# Patient Record
Sex: Male | Born: 1960 | Race: White | Hispanic: No | Marital: Married
Health system: Midwestern US, Academic
[De-identification: ages and names within clinical notes are randomized; demographics above are authoritative.]

---

## 2017-06-06 DIAGNOSIS — R69 Illness, unspecified: Principal | ICD-10-CM

## 2017-06-07 ENCOUNTER — Encounter: Admit: 2017-06-07 | Discharge: 2017-06-07

## 2017-07-05 ENCOUNTER — Encounter: Admit: 2017-07-05 | Discharge: 2017-07-05 | Payer: BC Managed Care – PPO

## 2017-07-10 ENCOUNTER — Ambulatory Visit: Admit: 2017-07-10 | Discharge: 2017-07-11 | Payer: Commercial Managed Care - HMO

## 2017-07-10 ENCOUNTER — Encounter: Admit: 2017-07-10 | Discharge: 2017-07-10 | Payer: BC Managed Care – PPO

## 2017-07-10 DIAGNOSIS — F419 Anxiety disorder, unspecified: Principal | ICD-10-CM

## 2017-07-10 DIAGNOSIS — Q283 Other malformations of cerebral vessels: Principal | ICD-10-CM

## 2017-07-10 DIAGNOSIS — R51 Headache: ICD-10-CM

## 2017-07-10 NOTE — Progress Notes
Devin Evans is a new patient to the clinic. He is referred for evaluation and treatment recommendations for findings on MRI concerning for cavernoma. Previous imaging is available for review on a disc. He c/o speech disturbance, headache, neck pain, fatigue, joint pain, hand pain, tremor in right hand. MRI initially ordered to rule out MS.

## 2017-07-23 ENCOUNTER — Encounter: Admit: 2017-07-23 | Discharge: 2017-07-23 | Payer: BC Managed Care – PPO

## 2017-07-23 DIAGNOSIS — R69 Illness, unspecified: Principal | ICD-10-CM

## 2017-09-11 ENCOUNTER — Ambulatory Visit: Admit: 2017-09-11 | Discharge: 2017-09-11 | Payer: Commercial Managed Care - HMO

## 2017-09-11 ENCOUNTER — Ambulatory Visit: Admit: 2017-09-11 | Discharge: 2017-09-12 | Payer: Commercial Managed Care - HMO

## 2017-09-11 ENCOUNTER — Encounter: Admit: 2017-09-11 | Discharge: 2017-09-11 | Payer: BC Managed Care – PPO

## 2017-09-11 DIAGNOSIS — R51 Headache: ICD-10-CM

## 2017-09-11 DIAGNOSIS — Q283 Other malformations of cerebral vessels: Principal | ICD-10-CM

## 2017-09-11 DIAGNOSIS — F419 Anxiety disorder, unspecified: Principal | ICD-10-CM

## 2017-09-11 DIAGNOSIS — R4701 Aphasia: Principal | ICD-10-CM

## 2017-09-11 MED ORDER — GADOBENATE DIMEGLUMINE 529 MG/ML (0.1MMOL/0.2ML) IV SOLN
20 mL | Freq: Once | INTRAVENOUS | 0 refills | Status: CP
Start: 2017-09-11 — End: ?
  Administered 2017-09-11: 20:00:00 20 mL via INTRAVENOUS

## 2017-09-11 NOTE — Progress Notes
Devin Evans returns to the clinic for a 2mth FUV. A repeat MRI of the head was done today for surveillance of abnormal findings in the left temporal region concerning for cavernoma. He was last seen in the clinic on 07/10/17. Devin Evans reports he is feeling well but states he has a reaction to the MRI contrast today (itching and hives.) Feels better now and hives resolved.

## 2017-09-11 NOTE — Progress Notes
1419: Tech alerted RN, pt has a hive. Pt stated when contrast was injected, felt itching around mouth and under left arm. RN assessed patient. Hive under left arm. Redness noted behind bilateral ears and back of neck. No pain, no edema. No SOA. No trouble swallowing.    1421: BP  160/92   O2  96   Pulse 59.    1425: Notified Radiologist Dr. Zenia ResidesArshan Dehbozorgi. Assessed pt. Hives and redness subsiding. Gait steady.     1430: Per Dr. Monte Fantasiaehbozorgi.Pt stable and OK to discharge.

## 2017-09-23 ENCOUNTER — Encounter: Admit: 2017-09-23 | Discharge: 2017-09-23 | Payer: BC Managed Care – PPO

## 2017-09-23 DIAGNOSIS — R4789 Other speech disturbances: Principal | ICD-10-CM

## 2017-10-11 ENCOUNTER — Encounter: Admit: 2017-10-11 | Discharge: 2017-10-11 | Payer: BC Managed Care – PPO

## 2017-10-11 NOTE — Telephone Encounter
Initial Visit Date: 10/28/17    Reason for Visit: spells of speech arrest    Is this as a result of an injury?     Physician Information  PCP: Dr. Theodore DemarkAnn Riggs  Referring Physician: Dr. Angelia MouldKoji [facility]  Previous Neurologist: Dr. Adair LaundryVernon [facility]  Other providers seen: [facility]    Previous Imaging/Procedures:  MRI head- 09/11/17 [place]; 06/09/17 [facility] (OR)  MRI C-spine- 06/09/17 [facility] (OR)  CT   XRay   EMG [facility]  EEG 07/17/17 [facility] ([facility])  [facility]?    ED/Hospitalizations:      Current Meds:       Medications, Allergies, History Updated

## 2017-10-28 ENCOUNTER — Encounter: Admit: 2017-10-28 | Discharge: 2017-10-28 | Payer: BC Managed Care – PPO

## 2017-10-28 ENCOUNTER — Ambulatory Visit: Admit: 2017-10-28 | Discharge: 2017-10-29

## 2017-10-28 DIAGNOSIS — F419 Anxiety disorder, unspecified: Principal | ICD-10-CM

## 2017-10-28 DIAGNOSIS — R51 Headache: ICD-10-CM

## 2017-10-28 DIAGNOSIS — N200 Calculus of kidney: ICD-10-CM

## 2017-10-28 DIAGNOSIS — M1990 Unspecified osteoarthritis, unspecified site: ICD-10-CM

## 2017-10-28 DIAGNOSIS — K219 Gastro-esophageal reflux disease without esophagitis: ICD-10-CM

## 2017-10-28 DIAGNOSIS — Q283 Other malformations of cerebral vessels: ICD-10-CM

## 2017-10-28 MED ORDER — ZONISAMIDE 100 MG PO CAP
200 mg | ORAL_CAPSULE | Freq: Every evening | ORAL | 5 refills | 90.00000 days | Status: AC
Start: 2017-10-28 — End: 2017-12-16

## 2017-10-29 DIAGNOSIS — G40109 Localization-related (focal) (partial) symptomatic epilepsy and epileptic syndromes with simple partial seizures, not intractable, without status epilepticus: Principal | ICD-10-CM

## 2017-10-29 DIAGNOSIS — R4789 Other speech disturbances: ICD-10-CM
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
## 2017-11-22 ENCOUNTER — Ambulatory Visit: Admit: 2017-11-22 | Discharge: 2017-11-22

## 2017-11-22 DIAGNOSIS — Q283 Other malformations of cerebral vessels: Principal | ICD-10-CM

## 2017-12-16 ENCOUNTER — Encounter: Admit: 2017-12-16 | Discharge: 2017-12-16 | Payer: BC Managed Care – PPO

## 2017-12-16 MED ORDER — ZONISAMIDE 100 MG PO CAP
200 mg | ORAL_CAPSULE | Freq: Every evening | ORAL | 1 refills | 90.00000 days | Status: AC
Start: 2017-12-16 — End: 2018-07-02

## 2018-03-15 IMAGING — CT Abdomen^1_KIDNEY_STONE (Adult)
1 series · 15 of 32 positions shown, 19 images · non-contrast
Comparison: none

PROCEDURE: Abdomen 1_KIDNEY_STONE (Adult)
HISTORY: LOWER ABD PAIN, FEVER, BURNING WITH URINATION. KIDNEY INFECTION. HX KIDNEY
STONES WITH RETRIEVAL 2 WKS AGO.
TECHNIQUE: Axial CT imaging of the abdomen and pelvis was performed without contrast.

[Series 2: kidney stone 3.0 soft tissue · axial · 0.88mm/px · z∈[-507,-99]mm · 15 of 151 slices shown, 19 images]
[im 10/151  soft-tissue]
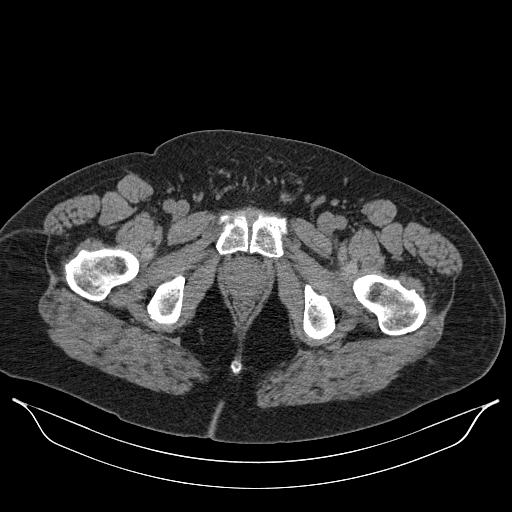
[im 10/151  bone]
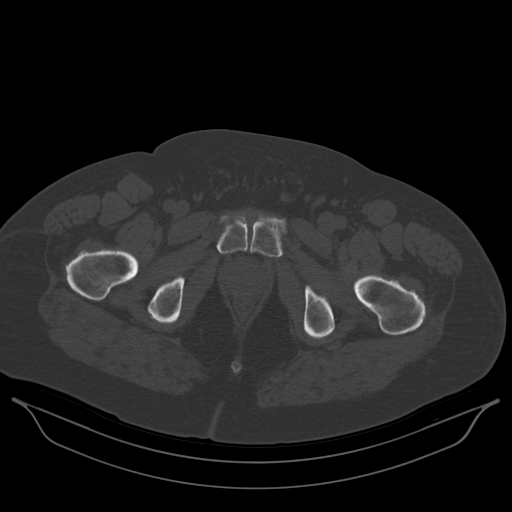
[im 20/151  soft-tissue]
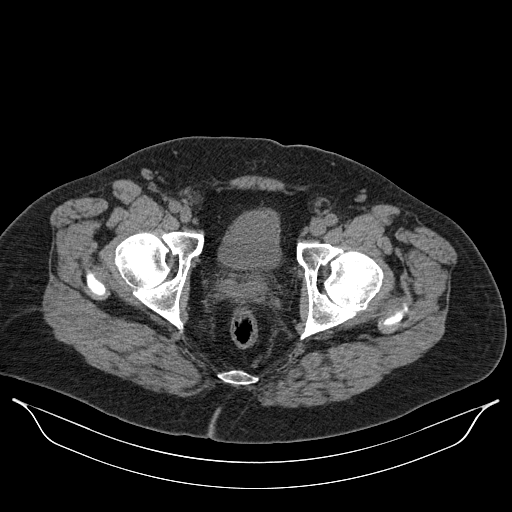
[im 30/151  soft-tissue]
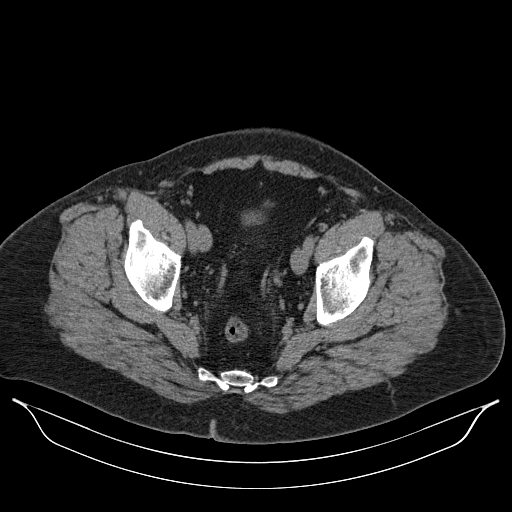
[im 44/151  soft-tissue]
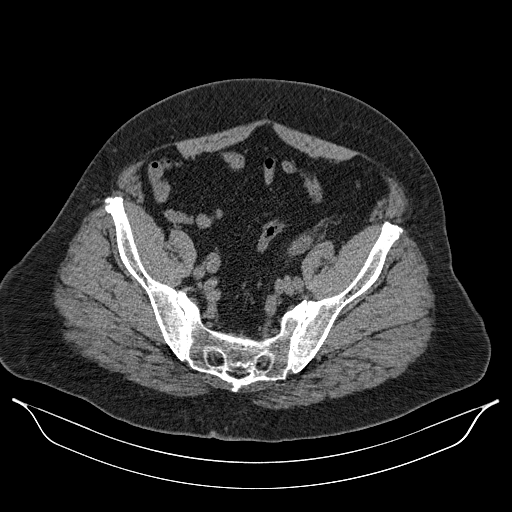
[im 54/151  soft-tissue]
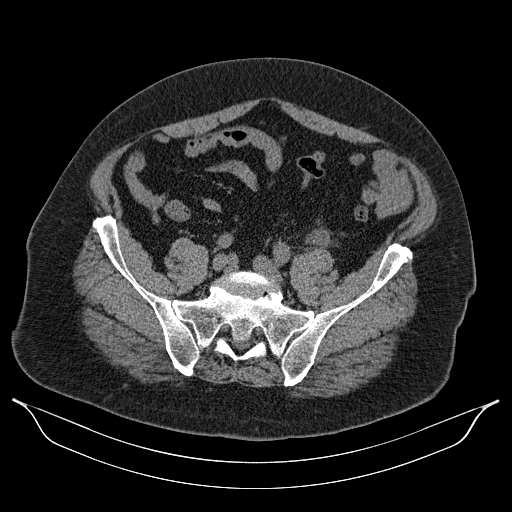
[im 63/151  soft-tissue]
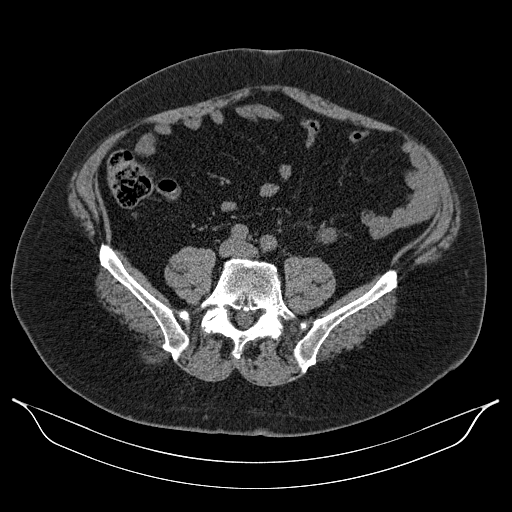
[im 78/151  soft-tissue]
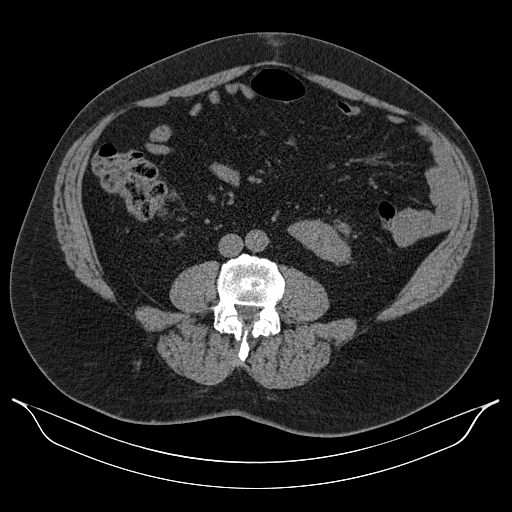
[im 88/151  soft-tissue]
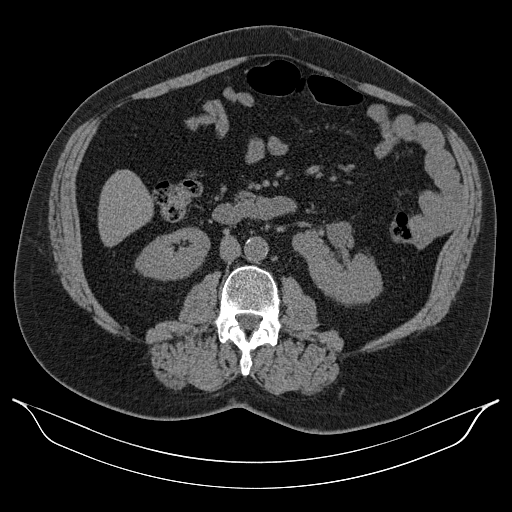
[im 97/151  soft-tissue]
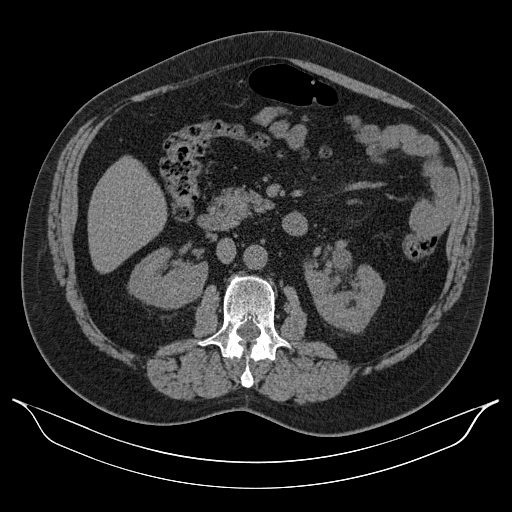
[im 97/151  bone]
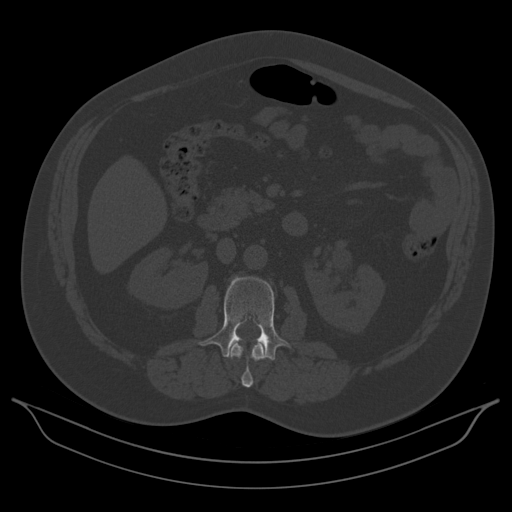
[im 107/151  soft-tissue]
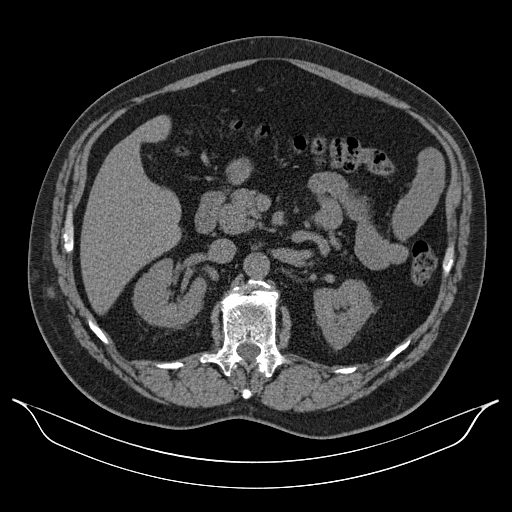
[im 121/151  soft-tissue]
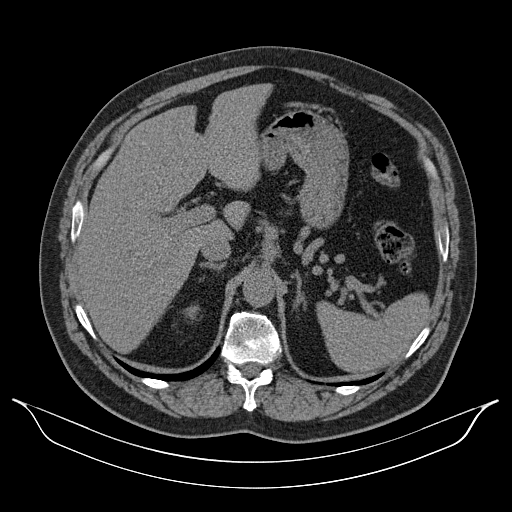
[im 131/151  soft-tissue]
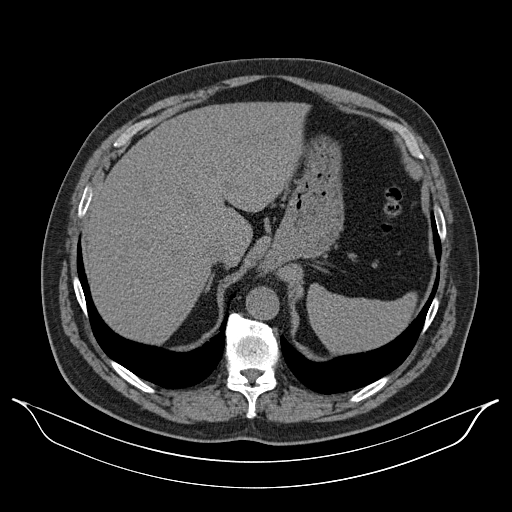
[im 131/151  lung]
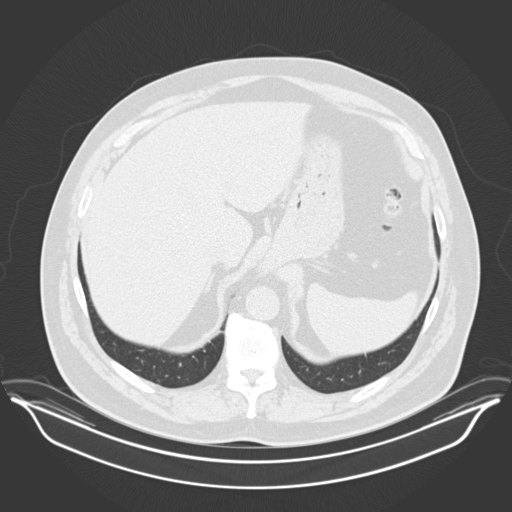
[im 136/151  lung]
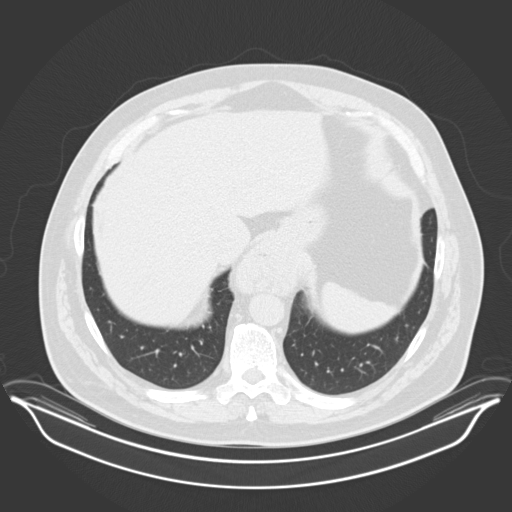
[im 141/151  soft-tissue]
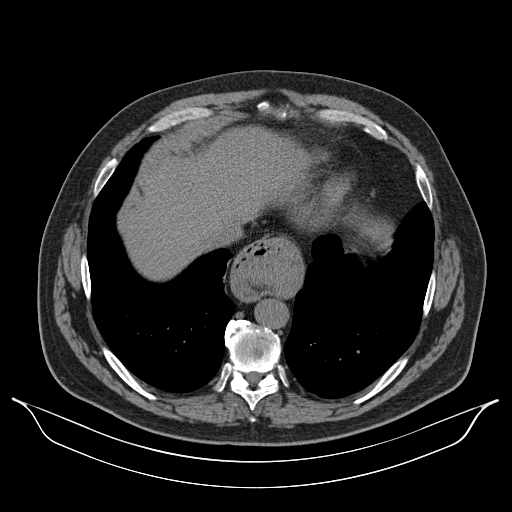
[im 141/151  lung]
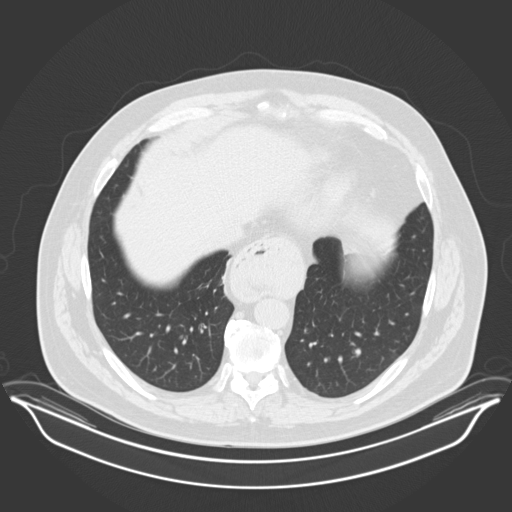
[im 146/151  lung]
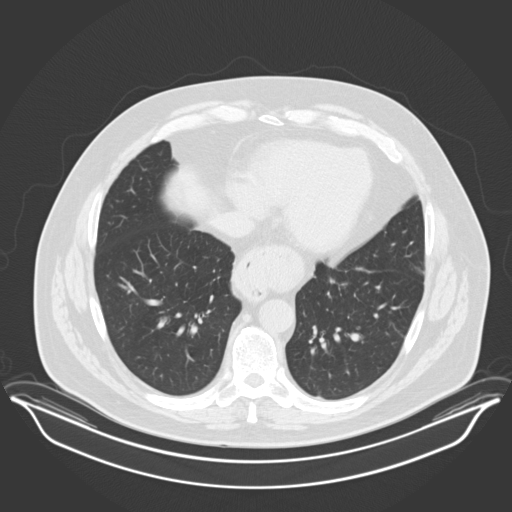

[15 of 32 positions shown; findings below may reference images not displayed]

FINDINGS: The lung bases are clear. Moderate-sized hiatal hernia is seen. The noncontrast
visualized portions of the liver, spleen, gallbladder, pancreas, and adrenals are within
normal limits. The right kidney shows no hydronephrosis, perinephric fat stranding, or
obstructing renal calculi. The left kidney is mildly malrotated with mild hydronephrosis and
moderate left hydroureter. No obstructing calculus is seen along the course of the dilated
ureter which is enlarged to the ureterovesicular junction.. The aorta is normal in course and
caliber. No pathologically dilated loops of large or small bowel are seen. Noninflammatory
appendix is seen in the right lower quadrant. There is no free air or free fluid seen in the
abdomen. The noncontrast urinary bladder is unremarkable.
IMPRESSION: 1. Mild hydronephrosis is seen of the left kidney with moderate hydroureter which is
enlarged to the level of the urinary bladder without visualized obstructing renal calculus.
This could reflect effects of recent passed renal calculus versus distal obstruction from
neoplasm or infection. Follow-up CT recommended.
2. Nonobstructive bowel gas pattern without free air.

Tech Notes:

ABD PAIN, FEVER, BURNING WITH URINATION.  HX RENAL STONES WITH RETRIEVAL 2 WKS AGO.  POSITIVE FOR
UTI AND KIDNEY INFECTION

## 2018-06-08 ENCOUNTER — Encounter: Admit: 2018-06-08 | Discharge: 2018-06-08 | Payer: BC Managed Care – PPO

## 2018-06-09 MED ORDER — ZONISAMIDE 100 MG PO CAP
200 mg | ORAL_CAPSULE | Freq: Every evening | ORAL | 1 refills
Start: 2018-06-09 — End: ?

## 2018-07-02 ENCOUNTER — Ambulatory Visit: Admit: 2018-07-02 | Discharge: 2018-07-03 | Payer: BC Managed Care – PPO

## 2018-07-02 ENCOUNTER — Encounter: Admit: 2018-07-02 | Discharge: 2018-07-02 | Payer: BC Managed Care – PPO

## 2018-07-02 DIAGNOSIS — Q283 Other malformations of cerebral vessels: ICD-10-CM

## 2018-07-02 DIAGNOSIS — K219 Gastro-esophageal reflux disease without esophagitis: ICD-10-CM

## 2018-07-02 DIAGNOSIS — G40109 Localization-related (focal) (partial) symptomatic epilepsy and epileptic syndromes with simple partial seizures, not intractable, without status epilepticus: Principal | ICD-10-CM

## 2018-07-02 DIAGNOSIS — R51 Headache: ICD-10-CM

## 2018-07-02 DIAGNOSIS — F419 Anxiety disorder, unspecified: Principal | ICD-10-CM

## 2018-07-02 DIAGNOSIS — M1990 Unspecified osteoarthritis, unspecified site: ICD-10-CM

## 2018-07-02 DIAGNOSIS — N200 Calculus of kidney: ICD-10-CM

## 2018-07-02 MED ORDER — ZONISAMIDE 100 MG PO CAP
300 mg | ORAL_CAPSULE | Freq: Every evening | ORAL | 1 refills | 90.00000 days | Status: AC
Start: 2018-07-02 — End: 2018-07-22

## 2018-07-03 ENCOUNTER — Encounter: Admit: 2018-07-03 | Discharge: 2018-07-03 | Payer: BC Managed Care – PPO

## 2018-07-03 MED ORDER — ZONISAMIDE 100 MG PO CAP
300 mg | ORAL_CAPSULE | Freq: Every evening | ORAL | 0 refills | 90.00000 days | Status: AC
Start: 2018-07-03 — End: 2018-07-17

## 2018-07-17 ENCOUNTER — Encounter: Admit: 2018-07-17 | Discharge: 2018-07-17 | Payer: BC Managed Care – PPO

## 2018-07-17 MED ORDER — ZONISAMIDE 100 MG PO CAP
300 mg | ORAL_CAPSULE | Freq: Every evening | ORAL | 0 refills | 90.00000 days | Status: AC
Start: 2018-07-17 — End: 2018-07-22

## 2018-07-21 ENCOUNTER — Encounter: Admit: 2018-07-21 | Discharge: 2018-07-21 | Payer: BC Managed Care – PPO

## 2018-07-22 MED ORDER — ZONISAMIDE 100 MG PO CAP
300 mg | ORAL_CAPSULE | Freq: Every evening | ORAL | 1 refills | 90.00000 days | Status: AC
Start: 2018-07-22 — End: 2019-02-23

## 2018-12-18 ENCOUNTER — Encounter: Admit: 2018-12-18 | Discharge: 2018-12-18 | Payer: BC Managed Care – PPO

## 2018-12-31 ENCOUNTER — Encounter: Admit: 2018-12-31 | Discharge: 2018-12-31 | Payer: BC Managed Care – PPO

## 2018-12-31 ENCOUNTER — Ambulatory Visit: Admit: 2018-12-31 | Discharge: 2019-01-01 | Payer: BC Managed Care – PPO

## 2018-12-31 DIAGNOSIS — Q283 Other malformations of cerebral vessels: ICD-10-CM

## 2018-12-31 DIAGNOSIS — N200 Calculus of kidney: ICD-10-CM

## 2018-12-31 DIAGNOSIS — G4733 Obstructive sleep apnea (adult) (pediatric): Principal | ICD-10-CM

## 2018-12-31 DIAGNOSIS — K219 Gastro-esophageal reflux disease without esophagitis: ICD-10-CM

## 2018-12-31 DIAGNOSIS — F419 Anxiety disorder, unspecified: Principal | ICD-10-CM

## 2018-12-31 DIAGNOSIS — R51 Headache: ICD-10-CM

## 2018-12-31 DIAGNOSIS — M1990 Unspecified osteoarthritis, unspecified site: ICD-10-CM

## 2018-12-31 MED ORDER — LAMOTRIGINE 25 MG PO TAB
ORAL_TABLET | 0 refills | Status: AC
Start: 2018-12-31 — End: 2019-07-20

## 2018-12-31 MED ORDER — LAMOTRIGINE 100 MG PO TAB
100 mg | ORAL_TABLET | Freq: Two times a day (BID) | ORAL | 5 refills | Status: AC
Start: 2018-12-31 — End: 2019-07-20

## 2018-12-31 NOTE — Progress Notes
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  We discussed several treatment options including increasing zonisamide, adding a second agent, and realistic expectations of seizure freedom with AEDs, which I think is unfortunately low.  We discussed the challenges of surgery given the location of his cavernoma so that's a difficult option as well.  We also discussed that as long as he does not have alteration of consciousness, it may be the best option just to make the best of medical management.    For now, it's certainly warranted to try a second medication so we will start with lamotrigine.  It will begin at 25mg  qHS and titrate up to 100mg  2x daily over an 8 week course.    His mild headaches, fatigue, and snoring do raise the possibility of OSA.  He will be referred to Dr. Andria Meuse for further evaluation and management.    I spent 30 minutes on the phone with Devin Evans and his wife.    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  Continue to follow with Dr. Lawson Fiscal for cavernoma surveillance  2.  Referral to Dr. Andria Meuse for sleep evaluation  3.  Trial lamotrigine 25mg  qHS and increase by 25mg  per week to a goal dose of 100mg  2x daily    The patient is instructed not to drive unless 6 months free of alteration

## 2019-02-23 ENCOUNTER — Encounter: Admit: 2019-02-23 | Discharge: 2019-02-23 | Payer: BC Managed Care – PPO

## 2019-02-23 MED ORDER — ZONISAMIDE 100 MG PO CAP
ORAL_CAPSULE | Freq: Every evening | ORAL | 1 refills | 90.00000 days | Status: DC
Start: 2019-02-23 — End: 2019-07-30

## 2019-02-23 NOTE — Telephone Encounter
Dr. Ronda Fairly received a refill request for zonisamide. This was not mentioned to continue in the last office note. Pt was also started on lamotrigine at the appt. Do you want him to continue the zonisamide at 300mg  QHS?

## 2019-07-09 ENCOUNTER — Encounter: Admit: 2019-07-09 | Discharge: 2019-07-09 | Payer: BC Managed Care – PPO

## 2019-07-13 ENCOUNTER — Encounter

## 2019-07-13 DIAGNOSIS — F419 Anxiety disorder, unspecified: Secondary | ICD-10-CM

## 2019-07-13 DIAGNOSIS — K219 Gastro-esophageal reflux disease without esophagitis: Secondary | ICD-10-CM

## 2019-07-13 DIAGNOSIS — Q283 Other malformations of cerebral vessels: Secondary | ICD-10-CM

## 2019-07-13 DIAGNOSIS — G4733 Obstructive sleep apnea (adult) (pediatric): Secondary | ICD-10-CM

## 2019-07-13 DIAGNOSIS — R51 Headache: Secondary | ICD-10-CM

## 2019-07-13 DIAGNOSIS — N200 Calculus of kidney: Secondary | ICD-10-CM

## 2019-07-13 DIAGNOSIS — M1990 Unspecified osteoarthritis, unspecified site: Secondary | ICD-10-CM

## 2019-07-13 NOTE — Assessment & Plan Note
HST 

## 2019-07-17 NOTE — Progress Notes
COVID-19 TELEHEALTH VISIT    History of Present Illness  I last saw Mr. Stalcup on 07/02/2018 for left FLE.  In the interim, he established with Dr. Andria Meuse for sleep issues with ordering of a sleep study.  He was trialed on lamotrigine.  He thinks he has moved to aura only.  It lasts a second or two.  He notes he has some balance difficulty which he describes as a slight stumble.  His wife definitely thinks his balance is worse.    His wife notes there are episodes with during sleeping that they call vivid dreams.  They note he is making these sorts of movements as he wakes up.  His wife seems they look deliberate and they aren't like he's uncontrolled.  Sometimes he can speak during them with variable intelligence.  He can turn towards the left during these.    Medication side effects  Vivid dreams, imbalance    Previous tests  1. [place] EEG (11/22/17) - One C3 > T7 sharp transient of unclear significance, otherwise normal (report reviewed)  ?  2.  [place] MRI brain (09/11/17)?- left frontal opercular cavernoma likely posterior to speech (imaging reviewed)    Review of systems  The following systems were reviewed and were negative except as mentioned elsewhere:      Constitutional:  No fever/chills/sweat, weight loss, or poor appetite  Eyes:  No reduced vision or blurriness, double vision, or droopy eyelids  ENT:  No hearing loss, ringing in the ears, vertigo, or hoarseness  Cardiovascular:  No chest pain/angina or palpitations  Respiratory:  No shortness of breath or cough  Gastrointestinal:  No abdominal pain, nausea and/or vomiting, diarrhea, or constipation  Genitourinary:  No pain with urination, excessive urination, or incontinence  Musculoskeletal:  No back pain, neck pain, joint pain/redness/swelling, or myalgia  Skin:  No jaundice, rash, or change in sweating  Hematologic/ Lymphatic:  No anemia, easy bruising, bleeding, or enlarged lymph nodes  Endocrine:  No temperature intolerance, polyuria, or polydipsia Allergic/ Immunological:  No severe allergic reaction  Psychiatric:  No depression or anxiety    Current Outpatient Medications on File Prior to Visit   Medication Sig Dispense Refill   ? citalopram (CELEXA) 20 mg tablet Take 20 mg by mouth daily.     ? lamoTRIgine (LAMICTAL) 100 mg tablet Take one tablet by mouth twice daily. 60 tablet 5   ? meloxicam (MOBIC) 15 mg tablet Take 15 mg by mouth daily.     ? MULTIVITAMIN PO Take 1 tablet by mouth daily.     ? omeprazole DR(+) (PRILOSEC) 40 mg capsule Take 40 mg by mouth daily before breakfast.     ? tamsulosin (FLOMAX) 0.4 mg capsule Take 0.4 mg by mouth daily. Do not crush, chew or open capsules. Take 30 minutes following the same meal each day.     ? zonisamide (ZONEGRAN) 100 mg capsule TAKE 3 CAPSULES DAILY AT   BEDTIME 270 capsule 1     No current facility-administered medications on file prior to visit.      Exam  General appearance: He is not in acute distress.    HEENT: The head is normocephalic without evidence of previous trauma.  Oropharynx is without obvious lesion.  Eyes: Normal sclera  Neck: Full range of motion  Pulmonary: Notable for unlabored breathing.    Skin: Exhibits normal temperature and showed no rash.    Extremities: No edema is noted.  Psychiatric: Normal thought process and appropriate affect  NEUROLOGICAL EXAM  Mental Status: The patient is attentive, alert, and oriented to person, time and place.  Language comprehension, naming, concentration, and recent/remote memory are intact.  Speech is fluent.    Cranial Nerves: Pupils are equal.  Visual fields are grossly intact.  Extraocular movements are intact without nystagmus.  Facial strength is normal.  Hearing is intact to conversation.  Tongue and uvular are in the midline.  Shoulder shrug is normal bilaterally.      Motor: Muscle bulk is normal.  Motor power is at least 3/5 in the bilateral deltoids, biceps, triceps, wrist extensors, grip, iliopsoas, quads, hamstrings, gastrocnemius, and tibialis anterior.  There is no pronator drift or fixation.    Sensory: Sensation is intact to light touch via patient touching his/her arm/leg and affirming sensation.    Cerebellar:  Finger to nose test shows no dysmetria.  Rapid alternating movements are normal.  There is no abnormal movement.      Gait: Normal stance, arm swing, and stride length.  No ataxia is noted.    Impression  CLINICAL SUMMARY   Mr. 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  We can't go up further on the lamotrigine due to side effect.  He may be having hypermotor seizures at night.  Therefore, we will taper off lamotrigine and trial lacosamide.    We had a candid conversation about the potential for surgery if he continues to have seizures on lacosamide.  The first step would be to undergo EMU testing.    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 Continue to follow with Dr. Lawson Fiscal for cavernoma surveillance  2. Continue zonisamide 300mg  qHS  3. Taper off lamotrigine  4. Start lacosamide 50mg  qHS and increase to 100mg  2x daily over a 6 week course    After a discussion, the patient agrees with the plan.  Total time for discussion was 25 minutes.  I spent 15 minutes counseling the patient on matters pertaining to epilepsy.    The patient is instructed not to drive unless 6 months free of alteration of consciousness, not to work in close proximity of machines with moving parts, not to swim unsupervised or to work at high places. The patient is to shower (without accumulation of water) instead of taking a bath if unsupervised.    FOLLOWUP PLAN  I plan to see the patient back for follow-up in approximately 6 months. The patient is instructed to contact us if there is an exacerbation of the seizures or any side effects from the antiepileptic medications.

## 2019-07-20 MED ORDER — LACOSAMIDE 50 MG PO TAB
50 mg | ORAL_TABLET | Freq: Two times a day (BID) | ORAL | 0 refills | 52.50000 days | Status: DC
Start: 2019-07-20 — End: 2019-09-22

## 2019-07-20 MED ORDER — LACOSAMIDE 100 MG PO TAB
100 mg | ORAL_TABLET | Freq: Two times a day (BID) | ORAL | 5 refills | 52.50000 days | Status: DC
Start: 2019-07-20 — End: 2019-09-22

## 2019-07-21 ENCOUNTER — Encounter: Admit: 2019-07-21 | Discharge: 2019-07-21 | Payer: BC Managed Care – PPO

## 2019-07-21 NOTE — Telephone Encounter
Pt LVM stating that he pharmacy did not received the prescriptions for Vimpat.     Called [facility] and confirmed that they did receive the prescriptions, but they did not have it in stock so it had to be ordered. Claim ran through insurance and one Rx was $743 and the other $221. Pt has a $1500 deductible that must be met first. Called pt and discussed this and instructed him to go to the website to enroll in copay card. Instructed pt that if the copay card does not lower the cost to a reasonable amount they can afford to please call back. Verbalized understanding.

## 2019-07-21 NOTE — Telephone Encounter
Called and discussed the titration schedules for Vimpat and lamotrigine. Pt was able to view this on his mychart AVS so he has this in writing. Directed pt to the [email or web address] website to enroll for a copay card. Instructed him to please call back if there is any issues with cost of this medication. Informed a PA may be needed and the pharmacy should notify the clinic if this is required. Verbalized understanding.

## 2019-07-30 ENCOUNTER — Encounter: Admit: 2019-07-30 | Discharge: 2019-07-30 | Payer: BC Managed Care – PPO

## 2019-07-30 MED ORDER — ZONISAMIDE 100 MG PO CAP
300 mg | ORAL_CAPSULE | Freq: Every evening | ORAL | 1 refills | 90.00000 days | Status: DC
Start: 2019-07-30 — End: 2020-01-28

## 2019-09-22 ENCOUNTER — Encounter: Admit: 2019-09-22 | Discharge: 2019-09-22

## 2019-09-22 MED ORDER — LAMOTRIGINE 25 MG PO TAB
25 mg | ORAL_TABLET | Freq: Every evening | ORAL | 0 refills | Status: DC
Start: 2019-09-22 — End: 2020-01-27

## 2019-09-22 MED ORDER — LAMOTRIGINE 100 MG PO TAB
100 mg | ORAL_TABLET | Freq: Two times a day (BID) | ORAL | 1 refills | Status: DC
Start: 2019-09-22 — End: 2020-01-27
  Filled 2019-09-22: qty 180, 90d supply, fill #1

## 2019-09-22 NOTE — Telephone Encounter
Per Dr. Estrella Myrtle, ok to restart lamotrigine. Start at 25mg  QHS and increase by 25mg  weekly until at a goal of 100mg  BID.    Lamotrigine titration instructions                    AM       PM   Week 1      0          25mg    Week 2      0          50mg    Week 3      0          75mg    Week 4      0         100mg    Week 5    25mg      100mg    Week 6    50mg      100mg    Week 7    75mg      100mg    Week 8    100mg    100mg      Called pt and informed of the next steps. Verbalized understanding. Pt did like the Vimpat better than the lamotrigine, but unfortunately at this time he would not be able to afford this cost. Lamotrigine 25mg  Rx sent to the local pharmacy for pt to get started on the titration schedule today. The maintenance Rx for 100mg  tablet sent to [facility], [place] order pharmacy.

## 2019-09-22 NOTE — Telephone Encounter
Dr. Estrella Myrtle, received voicemail from pt stating that he is having a problem getting his Vimpat covered. He stated the price has gone up significantly and is having to pay over $300 for a 30 day supply. The pharmacy also did not have this in stock and had to order the medication. He has been out of medication since Saturday. He has tried to use the copay card, but it is still costing him over $300. Pt is wondering if he could go back to the old prescription, lamotrigine?, since that was only ~$8/month.     Called pt's pharmacy to verify the issue. Per Butch Penny at [facility] the pt has a primary and secondary insurance that is still leaving him with a copay of over $700, the copay card brings it down to over $300. Myrle Sheng with Vimpat to discuss options. Was informed the that copay card for Vimpat only covers up to $1300 per YEAR. The pharmacist informed me that the first Rx for 50mg  the copay card paid ~$200, then the second Rx for the 100mg  tablet paid ~$724, then this 3rd filled only $376 would be covered. Unfortunately there is nothing else the Vimpat copay card or patient assistance program can do.     Please advise on next steps. Pt is completely out of Vimpat.

## 2019-09-23 ENCOUNTER — Encounter: Admit: 2019-09-23 | Discharge: 2019-09-23

## 2019-09-24 ENCOUNTER — Encounter: Admit: 2019-09-24 | Discharge: 2019-09-24

## 2020-01-18 ENCOUNTER — Encounter: Admit: 2020-01-18 | Discharge: 2020-01-18

## 2020-01-18 MED FILL — LAMOTRIGINE 100 MG PO TAB: 100 mg | ORAL | 90 days supply | Qty: 180 | Fill #2 | Status: CN

## 2020-01-20 ENCOUNTER — Encounter: Admit: 2020-01-20 | Discharge: 2020-01-20

## 2020-01-20 MED FILL — LAMOTRIGINE 100 MG PO TAB: 100 mg | ORAL | 90 days supply | Qty: 180 | Fill #2 | Status: AC

## 2020-01-27 ENCOUNTER — Encounter: Admit: 2020-01-27 | Discharge: 2020-01-27

## 2020-01-27 DIAGNOSIS — Q283 Other malformations of cerebral vessels: Secondary | ICD-10-CM

## 2020-01-27 DIAGNOSIS — F419 Anxiety disorder, unspecified: Secondary | ICD-10-CM

## 2020-01-27 DIAGNOSIS — R519 Generalized headaches: Secondary | ICD-10-CM

## 2020-01-27 DIAGNOSIS — M1990 Unspecified osteoarthritis, unspecified site: Secondary | ICD-10-CM

## 2020-01-27 DIAGNOSIS — N200 Calculus of kidney: Secondary | ICD-10-CM

## 2020-01-27 DIAGNOSIS — K219 Gastro-esophageal reflux disease without esophagitis: Secondary | ICD-10-CM

## 2020-01-27 MED ORDER — LAMOTRIGINE 100 MG PO TAB
200 mg | ORAL_TABLET | Freq: Two times a day (BID) | ORAL | 3 refills | Status: AC
Start: 2020-01-27 — End: ?

## 2020-01-27 MED ORDER — LAMOTRIGINE 25 MG PO TAB
ORAL_TABLET | 0 refills | Status: DC
Start: 2020-01-27 — End: 2020-01-28
  Filled 2020-01-28: qty 84, 49d supply, fill #1

## 2020-01-28 ENCOUNTER — Encounter: Admit: 2020-01-28 | Discharge: 2020-01-28

## 2020-01-28 MED ORDER — ZONISAMIDE 100 MG PO CAP
300 mg | ORAL_CAPSULE | Freq: Every evening | ORAL | 1 refills | 90.00000 days | Status: AC
Start: 2020-01-28 — End: ?
  Filled 2020-01-31: qty 270, 90d supply, fill #1

## 2020-01-28 MED ORDER — LAMOTRIGINE 25 MG PO TAB
ORAL_TABLET | 0 refills | Status: DC
Start: 2020-01-28 — End: 2020-02-08

## 2020-01-29 ENCOUNTER — Encounter: Admit: 2020-01-29 | Discharge: 2020-01-29

## 2020-01-31 ENCOUNTER — Encounter: Admit: 2020-01-31 | Discharge: 2020-01-31

## 2020-02-08 MED ORDER — LAMOTRIGINE 25 MG PO TAB
ORAL_TABLET | Freq: Every evening | 0 refills | Status: AC
Start: 2020-02-08 — End: ?
  Filled 2020-02-09: qty 84, 70d supply, fill #1

## 2020-03-29 ENCOUNTER — Encounter: Admit: 2020-03-29 | Discharge: 2020-03-29

## 2020-03-30 MED FILL — LAMOTRIGINE 100 MG PO TAB: 100 mg | ORAL | 90 days supply | Qty: 360 | Fill #1 | Status: AC

## 2020-06-28 ENCOUNTER — Encounter: Admit: 2020-06-28 | Discharge: 2020-06-28

## 2020-06-28 MED FILL — LAMOTRIGINE 100 MG PO TAB: 100 mg | ORAL | 90 days supply | Qty: 360 | Fill #2 | Status: AC

## 2020-07-13 ENCOUNTER — Encounter: Admit: 2020-07-13 | Discharge: 2020-07-13

## 2020-07-18 ENCOUNTER — Encounter: Admit: 2020-07-18 | Discharge: 2020-07-18

## 2020-07-29 ENCOUNTER — Encounter: Admit: 2020-07-29 | Discharge: 2020-07-29

## 2020-07-29 MED ORDER — ZONISAMIDE 100 MG PO CAP
200 mg | ORAL_CAPSULE | Freq: Every evening | ORAL | 0 refills | 90.00000 days | Status: AC
Start: 2020-07-29 — End: ?
  Filled 2020-07-29: qty 270, 90d supply, fill #2

## 2020-07-31 ENCOUNTER — Encounter: Admit: 2020-07-31 | Discharge: 2020-07-31

## 2020-07-31 MED ORDER — LAMOTRIGINE 25 MG PO TAB
ORAL_TABLET | Freq: Every evening | 0 refills
Start: 2020-07-31 — End: ?

## 2020-09-19 ENCOUNTER — Encounter: Admit: 2020-09-19 | Discharge: 2020-09-19

## 2020-09-21 ENCOUNTER — Encounter: Admit: 2020-09-21 | Discharge: 2020-09-21

## 2020-09-21 MED FILL — LAMOTRIGINE 100 MG PO TAB: 100 mg | ORAL | 90 days supply | Qty: 360 | Fill #3 | Status: AC

## 2020-10-03 ENCOUNTER — Encounter: Admit: 2020-10-03 | Discharge: 2020-10-03

## 2020-10-05 ENCOUNTER — Encounter: Admit: 2020-10-05 | Discharge: 2020-10-05

## 2020-11-01 ENCOUNTER — Encounter: Admit: 2020-11-01 | Discharge: 2020-11-01

## 2020-11-01 MED ORDER — ZONISAMIDE 100 MG PO CAP
300 mg | ORAL_CAPSULE | Freq: Every evening | ORAL | 1 refills | 90.00000 days | Status: AC
Start: 2020-11-01 — End: ?
  Filled 2020-11-01: qty 270, 90d supply, fill #1

## 2020-12-20 ENCOUNTER — Encounter: Admit: 2020-12-20 | Discharge: 2020-12-20

## 2020-12-20 MED FILL — LAMOTRIGINE 100 MG PO TAB: 100 mg | ORAL | 90 days supply | Qty: 360 | Fill #4 | Status: AC

## 2021-01-25 ENCOUNTER — Encounter: Admit: 2021-01-25 | Discharge: 2021-01-25

## 2021-01-25 MED FILL — ZONISAMIDE 100 MG PO CAP: 100 mg | ORAL | 90 days supply | Qty: 270 | Fill #2 | Status: AC

## 2021-03-21 ENCOUNTER — Encounter: Admit: 2021-03-21 | Discharge: 2021-03-21

## 2021-03-21 MED FILL — LAMOTRIGINE 100 MG PO TAB: 100 mg | ORAL | 90 days supply | Qty: 360 | Fill #1 | Status: AC

## 2021-04-25 ENCOUNTER — Encounter: Admit: 2021-04-25 | Discharge: 2021-04-25

## 2021-04-25 MED ORDER — ZONISAMIDE 100 MG PO CAP
300 mg | ORAL_CAPSULE | Freq: Every evening | ORAL | 1 refills
Start: 2021-04-25 — End: ?

## 2021-04-26 ENCOUNTER — Encounter: Admit: 2021-04-26 | Discharge: 2021-04-26

## 2021-04-26 MED FILL — ZONISAMIDE 100 MG PO CAP: 100 mg | ORAL | 30 days supply | Qty: 90 | Fill #1 | Status: AC

## 2021-05-02 ENCOUNTER — Encounter: Admit: 2021-05-02 | Discharge: 2021-05-02

## 2021-05-02 ENCOUNTER — Ambulatory Visit: Admit: 2021-05-02 | Discharge: 2021-05-03 | Payer: BC Managed Care – PPO

## 2021-05-02 DIAGNOSIS — Q283 Other malformations of cerebral vessels: Secondary | ICD-10-CM

## 2021-05-02 DIAGNOSIS — N200 Calculus of kidney: Secondary | ICD-10-CM

## 2021-05-02 DIAGNOSIS — R519 Generalized headaches: Secondary | ICD-10-CM

## 2021-05-02 DIAGNOSIS — M1990 Unspecified osteoarthritis, unspecified site: Secondary | ICD-10-CM

## 2021-05-02 DIAGNOSIS — G40109 Localization-related (focal) (partial) symptomatic epilepsy and epileptic syndromes with simple partial seizures, not intractable, without status epilepticus: Secondary | ICD-10-CM

## 2021-05-02 DIAGNOSIS — K219 Gastro-esophageal reflux disease without esophagitis: Secondary | ICD-10-CM

## 2021-05-02 DIAGNOSIS — F419 Anxiety disorder, unspecified: Secondary | ICD-10-CM

## 2021-05-02 MED ORDER — ZONISAMIDE 100 MG PO CAP
200 mg | ORAL_CAPSULE | Freq: Every evening | ORAL | 3 refills | Status: CN
Start: 2021-05-02 — End: ?

## 2021-05-02 MED ORDER — LAMOTRIGINE 100 MG PO TAB
200 mg | ORAL_TABLET | Freq: Two times a day (BID) | ORAL | 3 refills | Status: CN
Start: 2021-05-02 — End: ?

## 2021-05-02 NOTE — Progress Notes
COVID-19 TELEHEALTH VISIT    History of Present Illness  I last saw Mr. Devin Evans on 01/27/2020 for left FLE related to a left frontal opercular cavernoma.  We discussed decreasing the zonisamide to 200mg , but today he is taking 300mg .  He continues to have occasional seizures.  He describes these as primarily aphasic seizures.  During these he can feel like his face gets hot and can feel like he might black out.  He can have some nocturnal hypermotor seizures as well, probably every 3-4 months.    Medication side effects  Fatigue    Previous tests  1. [place] EEG (11/22/17) - One C3 > T7 sharp transient of unclear significance, otherwise normal (report reviewed)  ?  2. [place] MRI brain (09/11/17)?- left frontal opercular cavernoma (imaging reviewed)    Review of systems  The following systems were reviewed and were negative except as mentioned elsewhere:      Constitutional:  No fever/chills/sweat, weight loss, or poor appetite  Eyes:  No reduced vision or blurriness, double vision, or droopy eyelids  ENT:  No hearing loss, ringing in the ears, vertigo, or hoarseness  Cardiovascular:  No chest pain/angina or palpitations  Respiratory:  No shortness of breath or cough  Gastrointestinal:  No abdominal pain, nausea and/or vomiting, diarrhea, or constipation  Genitourinary:  No pain with urination, excessive urination, or incontinence  Musculoskeletal:  No back pain, neck pain, joint pain/redness/swelling, or myalgia  Skin:  No jaundice, rash, or change in sweating  Hematologic/ Lymphatic:  No anemia, easy bruising, bleeding, or enlarged lymph nodes  Endocrine:  No temperature intolerance, polyuria, or polydipsia  Allergic/ Immunological:  No severe allergic reaction  Psychiatric:  No depression or anxiety    Current Outpatient Medications on File Prior to Visit   Medication Sig Dispense Refill   ? atorvastatin (LIPITOR) 40 mg tablet Take 40 mg by mouth daily.     ? citalopram (CELEXA) 20 mg tablet Take 20 mg by mouth daily.     ? lamoTRIgine (LAMICTAL) 100 mg tablet Take two tablets by mouth twice daily. 360 tablet 0   ? meloxicam (MOBIC) 15 mg tablet Take 15 mg by mouth daily.     ? MULTIVITAMIN PO Take 1 tablet by mouth daily.     ? omeprazole DR(+) (PRILOSEC) 40 mg capsule Take 40 mg by mouth daily before breakfast.     ? tamsulosin (FLOMAX) 0.4 mg capsule Take 0.4 mg by mouth daily. Do not crush, chew or open capsules. Take 30 minutes following the same meal each day.     ? zonisamide (ZONEGRAN) 100 mg capsule Take three capsules by mouth at bedtime daily. 90 capsule 0     No current facility-administered medications on file prior to visit.     Exam  General appearance: He is not in acute distress.    HEENT: The head is normocephalic without evidence of previous trauma.  Oropharynx is without obvious lesion.  Eyes: Normal sclera  Neck: Full range of motion  Pulmonary: Notable for unlabored breathing.    Skin: Exhibits normal temperature and showed no rash.    Extremities: No edema is noted.  Psychiatric: Normal thought process and appropriate affect    NEUROLOGICAL EXAM  Mental Status: The patient is attentive, alert, and oriented to person, time and place.  Language comprehension, naming, concentration, and recent/remote memory are intact.  Speech is fluent.    Cranial Nerves: Pupils are equal.  Visual fields are grossly intact.  Extraocular movements  are intact without nystagmus.  Facial strength is normal.  Hearing is intact to conversation.  Tongue and uvular are in the midline.  Shoulder shrug is normal bilaterally.      Motor: Muscle bulk is normal.  Motor power is at least 3/5 in the bilateral deltoids, biceps, triceps, wrist extensors, grip, iliopsoas, quads, hamstrings, gastrocnemius, and tibialis anterior.    Sensory: Unable to assess    Cerebellar:  There is no abnormal movement.      Gait: Deferred    Impression  CLINICAL SUMMARY   [name] is a pleasant 60 year old man with left FLE related to a cavernoma.  He reports being 95% improved after increasing his ASMs which has been balanced by fatigue from this.  I'll try decreasing his zonisamide to see if that can help.  We discussed his seizures may increase and we could consider going back up on zonisamide.  Still, if the increase is mild and he has improvement in his fatigue, we could stay on the lower zonisamide dosing.    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 Check MRI brain for cavernoma surveillance  2. Decrease zonisamide to 200mg  qHS due to fatigue  3. Continue lamotrigine 200mg  2x daily    MANAGEMENT AND PLAN  During the visit I discussed my impression, recommended diagnostic studies, prognosis, risks and benefits of management, instructions for management, and importance of compliance.  After a discussion, the patient agrees with the plan.  Total time for clinic visit was 40 minutes.    The patient is instructed not to drive unless 6 months free of alteration of consciousness, not to work in close proximity of machines with moving parts, not to swim unsupervised or to work at high places. The patient is to shower (without accumulation of water) instead of taking a bath if unsupervised.    FOLLOWUP PLAN  I plan to see the patient back for follow-up in approximately 12 months.  The patient is instructed to contact us if there is an exacerbation of the seizures or any side effects from the antiepileptic medications.

## 2021-05-03 ENCOUNTER — Encounter: Admit: 2021-05-03 | Discharge: 2021-05-03

## 2021-05-04 ENCOUNTER — Encounter: Admit: 2021-05-04 | Discharge: 2021-05-04

## 2021-05-11 ENCOUNTER — Encounter: Admit: 2021-05-11 | Discharge: 2021-05-11

## 2021-05-15 ENCOUNTER — Encounter: Admit: 2021-05-15 | Discharge: 2021-05-15

## 2021-05-19 ENCOUNTER — Encounter: Admit: 2021-05-19 | Discharge: 2021-05-19

## 2021-05-19 MED FILL — ZONISAMIDE 100 MG PO CAP: 100 mg | ORAL | 90 days supply | Qty: 180 | Fill #1 | Status: AC

## 2021-06-14 ENCOUNTER — Encounter: Admit: 2021-06-14 | Discharge: 2021-06-14

## 2021-06-14 MED FILL — LAMOTRIGINE 100 MG PO TAB: 100 mg | ORAL | 90 days supply | Qty: 360 | Fill #1 | Status: AC

## 2021-06-16 ENCOUNTER — Ambulatory Visit: Admit: 2021-06-16 | Discharge: 2021-06-16 | Payer: BC Managed Care – PPO

## 2021-06-16 ENCOUNTER — Encounter: Admit: 2021-06-16 | Discharge: 2021-06-16 | Payer: MEDICARE

## 2021-06-16 DIAGNOSIS — Q283 Other malformations of cerebral vessels: Secondary | ICD-10-CM

## 2021-06-16 NOTE — Telephone Encounter
Spoke with pt, discussed MRI results per Dr. Ronda Fairly note which show MRI is stable. Pt stated he is doing well with lower dose of zonisamide, fatigue is improved (lessened) feels this is a good dose. Pt stated he has had one dream where he woke up swinging. Pt denies any space out episodes during day.  Pt also stated he had f/u scans from Covid he had in December which showed something on lung, having biopsy on 06/20/21.  Will inform Dr. Ronda Fairly

## 2021-06-21 ENCOUNTER — Encounter: Admit: 2021-06-21 | Discharge: 2021-06-21 | Payer: BC Managed Care – PPO

## 2021-06-21 NOTE — Telephone Encounter
Spoke with pt, discussed Dr. Ronda Fairly recommendation to keep everything the same. 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 Pt stated he has not had daytime seizures for months. Does have seizures during sleep. Pt will ask wife when the last time this o ccurred. Discussed with pt that he need to follow seizure precautions until 6 months seizure free. Will forward to Dr. Ronda Fairly

## 2021-07-30 ENCOUNTER — Encounter: Admit: 2021-07-30 | Discharge: 2021-07-30 | Payer: BC Managed Care – PPO

## 2021-08-13 ENCOUNTER — Encounter: Admit: 2021-08-13 | Discharge: 2021-08-13 | Payer: BC Managed Care – PPO

## 2021-08-13 MED FILL — ZONISAMIDE 100 MG PO CAP: 100 mg | ORAL | 90 days supply | Qty: 180 | Fill #2 | Status: AC

## 2021-09-20 ENCOUNTER — Encounter: Admit: 2021-09-20 | Discharge: 2021-09-20 | Payer: BC Managed Care – PPO

## 2021-09-20 MED FILL — LAMOTRIGINE 100 MG PO TAB: 100 mg | ORAL | 90 days supply | Qty: 360 | Fill #2 | Status: AC

## 2021-11-10 ENCOUNTER — Encounter: Admit: 2021-11-10 | Discharge: 2021-11-10 | Payer: BC Managed Care – PPO

## 2021-11-11 ENCOUNTER — Encounter: Admit: 2021-11-11 | Discharge: 2021-11-11 | Payer: BC Managed Care – PPO

## 2021-12-14 ENCOUNTER — Encounter: Admit: 2021-12-14 | Discharge: 2021-12-14 | Payer: BC Managed Care – PPO

## 2021-12-29 ENCOUNTER — Encounter: Admit: 2021-12-29 | Discharge: 2021-12-29 | Payer: BC Managed Care – PPO

## 2021-12-29 MED FILL — LAMOTRIGINE 100 MG PO TAB: 100 mg | ORAL | 90 days supply | Qty: 360 | Fill #3 | Status: AC

## 2021-12-29 MED FILL — ZONISAMIDE 100 MG PO CAP: 100 mg | ORAL | 90 days supply | Qty: 180 | Fill #3 | Status: AC

## 2022-01-02 ENCOUNTER — Encounter: Admit: 2022-01-02 | Discharge: 2022-01-02 | Payer: BC Managed Care – PPO

## 2022-03-24 ENCOUNTER — Encounter: Admit: 2022-03-24 | Discharge: 2022-03-24 | Payer: BC Managed Care – PPO

## 2022-05-04 ENCOUNTER — Encounter: Admit: 2022-05-04 | Discharge: 2022-05-04 | Payer: BC Managed Care – PPO

## 2022-05-04 ENCOUNTER — Ambulatory Visit: Admit: 2022-05-04 | Discharge: 2022-05-05 | Payer: BC Managed Care – PPO

## 2022-05-04 DIAGNOSIS — M1990 Unspecified osteoarthritis, unspecified site: Secondary | ICD-10-CM

## 2022-05-04 DIAGNOSIS — F419 Anxiety disorder, unspecified: Secondary | ICD-10-CM

## 2022-05-04 DIAGNOSIS — Q283 Other malformations of cerebral vessels: Secondary | ICD-10-CM

## 2022-05-04 DIAGNOSIS — R519 Generalized headaches: Secondary | ICD-10-CM

## 2022-05-04 DIAGNOSIS — G40109 Localization-related (focal) (partial) symptomatic epilepsy and epileptic syndromes with simple partial seizures, not intractable, without status epilepticus: Secondary | ICD-10-CM

## 2022-05-04 DIAGNOSIS — K219 Gastro-esophageal reflux disease without esophagitis: Secondary | ICD-10-CM

## 2022-05-04 DIAGNOSIS — N200 Calculus of kidney: Secondary | ICD-10-CM

## 2022-05-04 MED ORDER — ZONISAMIDE 100 MG PO CAP
200 mg | ORAL_CAPSULE | Freq: Every evening | ORAL | 3 refills | 90.00000 days | Status: AC
Start: 2022-05-04 — End: ?

## 2022-05-04 MED ORDER — LAMOTRIGINE 100 MG PO TAB
200 mg | ORAL_TABLET | Freq: Two times a day (BID) | ORAL | 3 refills | Status: AC
Start: 2022-05-04 — End: ?

## 2022-05-04 NOTE — Progress Notes
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  We decreased zonisamide at the last visit.  He has occasional aura/aphasic seizures most months.  He hasn't lost consciousness or awareness.    Medication side effects  None    Previous tests  1. [place] EEG (11/22/17) - One C3 > T7 sharp transient of unclear significance, otherwise normal (report reviewed)  ?  2. [place] MRI brain (09/11/17 and 06/16/21)?- left frontal opercular cavernoma (imaging reviewed)    Review of systems  The following systems were reviewed and were negative except as mentioned elsewhere:      Constitutional:  No fever/chills/sweat, weight loss, or poor appetite  Eyes:  No reduced vision or blurriness, double vision, or droopy eyelids  ENT:  No hearing loss, ringing in the ears, vertigo, or hoarseness  Cardiovascular:  No chest pain/angina or palpitations  Respiratory:  No shortness of breath or cough  Gastrointestinal:  No abdominal pain, nausea and/or vomiting, diarrhea, or constipation  Genitourinary:  No pain with urination, excessive urination, or incontinence  Musculoskeletal:  No back pain, neck pain, joint pain/redness/swelling, or myalgia  Skin:  No jaundice, rash, or change in sweating  Hematologic/ Lymphatic:  No anemia, easy bruising, bleeding, or enlarged lymph nodes  Endocrine:  No temperature intolerance, polyuria, or polydipsia  Allergic/ Immunological:  No severe allergic reaction  Psychiatric:  No depression or anxiety    Current Outpatient Medications on File Prior to Visit   Medication Sig Dispense Refill   ? atorvastatin (LIPITOR) 40 mg tablet Take 40 mg by mouth daily.     ? citalopram (CELEXA) 20 mg tablet Take 20 mg by mouth daily.     ? lamoTRIgine (LAMICTAL) 100 mg tablet Take two tablets by mouth twice daily. 360 tablet 3   ? meloxicam (MOBIC) 15 mg tablet Take 15 mg by mouth daily.     ? MULTIVITAMIN PO Take 1 tablet by mouth daily. ? omeprazole DR(+) (PRILOSEC) 40 mg capsule Take 40 mg by mouth daily before breakfast.     ? tamsulosin (FLOMAX) 0.4 mg capsule Take 0.4 mg by mouth daily. Do not crush, chew or open capsules. Take 30 minutes following the same meal each day.     ? zonisamide (ZONEGRAN) 100 mg capsule Take two capsules by mouth at bedtime daily. 180 capsule 3     No current facility-administered medications on file prior to visit.     Exam  General appearance: He is not in acute distress.    HEENT: The head is normocephalic without evidence of previous trauma.  Oropharynx is without obvious lesion.  Eyes: Normal sclera  Neck: Full range of motion  Pulmonary: Notable for unlabored breathing.    Skin: Exhibits normal temperature and showed no rash.    Extremities: No edema is noted.  Psychiatric: Normal thought process and appropriate affect    NEUROLOGICAL EXAM  Mental Status: The patient is attentive, alert, and oriented to person, time and place.  Language comprehension, naming, concentration, and recent/remote memory are intact.  Speech is fluent.    Cranial Nerves: Pupils are equal.  Visual fields are grossly intact.  Extraocular movements are intact without nystagmus.  Facial strength is normal.  Hearing is intact to conversation.  Tongue and uvular are in the midline.  Shoulder shrug is normal bilaterally.      Motor: Muscle bulk is normal.  Motor power  is at least 3/5 in the bilateral deltoids, biceps, triceps, wrist extensors, grip, iliopsoas, quads, hamstrings, gastrocnemius, and tibialis anterior.    Sensory: Unable to assess    Cerebellar:  There is no abnormal movement.      Gait: Deferred    Impression  CLINICAL SUMMARY   Mr. Devin Evans is a pleasant 61 year old man with left FLE related to a cavernoma.  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  Therefore, we won't make changes today.    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 Continue zonisamide 200mg  qHS  2. Continue lamotrigine 200mg  2x daily    MANAGEMENT AND PLAN  During the visit I discussed my impression, recommended diagnostic studies, prognosis, risks and benefits of management, instructions for management, and importance of compliance.  After a discussion, the patient agrees with the plan.  Total time for telehealth with video visit was 20 minutes.    The patient is instructed not to drive unless 6 months free of alteration of consciousness, not to work in close proximity of machines with moving parts, not to swim unsupervised or to work at high places. The patient is to shower (without accumulation of water) instead of taking a bath if unsupervised.    FOLLOWUP PLAN  I plan to see the patient back for follow-up in approximately 6 months with Joe and 12 months with me.  The patient is instructed to contact us if there is an exacerbation of the seizures or any side effects from the antiepileptic medications.    This is a telemedicine visit that was performed with the originating site at [facility] and the distant site at his home.  Verbal consent to participate in telehealth visit was obtained. I discussed with the patient the nature of our telemedicine visits, that:    ? I would evaluate the patient and recommend diagnostics and treatments based on my assessment  ? Our sessions are not being recorded and that personal health information is protected  ? Our team would provide follow up care in person if/when the patient needs it

## 2022-11-05 ENCOUNTER — Encounter: Admit: 2022-11-05 | Discharge: 2022-11-05 | Payer: MEDICARE

## 2022-11-05 ENCOUNTER — Ambulatory Visit: Admit: 2022-11-05 | Discharge: 2022-11-06 | Payer: MEDICARE

## 2022-11-05 MED ORDER — ZONISAMIDE 100 MG PO CAP
200 mg | ORAL_CAPSULE | Freq: Every evening | ORAL | 3 refills | 90.00000 days | Status: AC
Start: 2022-11-05 — End: ?

## 2022-11-05 MED ORDER — LAMOTRIGINE 100 MG PO TAB
200 mg | ORAL_TABLET | Freq: Two times a day (BID) | ORAL | 3 refills | Status: AC
Start: 2022-11-05 — End: ?

## 2022-11-05 NOTE — Progress Notes
Tele-Health Progress Note       Patient Name: Devin Evans  Age: 62 y.o.  Sex: male    Encounter Date: 11/05/2022  Date of tele-health visit:11/05/22  MRN: [id number]  PCP: Samuel Germany     Reason for visit/ chief complaint:  Seizure/epilepsy follow up     INTERVAL SUMMARY        Devin Evans last visit was on 05/04/2022 with Dr. Ardelle Lesches. Occasionally will have episodes of autonomic auras of speech problems in the order of one every 1-2 months. No other issues reported.       REVIEW OF SYSTEMS     Review of Systems:   A 10-point ROS is negative with the exception of pertinent positives as noted above in the HPI.       Previously documented summary (copied and reviewed)     CLINICAL SUMMARY   Devin Evans is a pleasant 62 year old man with left FLE related to a cavernoma.  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  Therefore, we won't make changes today.     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    Cerebral cavernoma 07/10/2017       PAST SURGICAL HISTORY     Surgical History:   Procedure Laterality Date    HX CHOLECYSTECTOMY      NASAL POLYP SURGERY         FAMILY HISTORY     Family History   Problem Relation Age of Onset    Seizures Neg Hx     Stroke Neg Hx     Migraines Neg Hx     Dementia Neg Hx     Cancer Neg Hx        SOCIAL HISTORY     Social History Socioeconomic History    Marital status: Married    Years of education: 12   Occupational History    Occupation: Orthoptist   Tobacco Use    Smoking status: Former     Types: Cigars    Smokeless tobacco: Never   Substance and Sexual Activity    Alcohol use: Not Currently    Drug use: Never    Sexual activity: Yes   Social History Narrative    Lives with wife       Social History     Substance and Sexual Activity   Drug Use Never         Social History     Tobacco Use   Smoking Status Former    Types: Cigars  Smokeless Tobacco Never       MEDICATION     Current Outpatient Medications   Medication Sig Dispense Refill    atorvastatin (LIPITOR) 40 mg tablet Take one tablet by mouth daily.      citalopram (CELEXA) 20 mg tablet Take one tablet by mouth daily.      lamoTRIgine (LAMICTAL) 100 mg tablet Take two tablets by mouth twice daily. 360 tablet 3    meloxicam (MOBIC) 15 mg tablet Take one tablet by mouth daily.      MULTIVITAMIN PO Take 1 tablet by mouth daily.      omeprazole DR(+) (PRILOSEC) 40 mg capsule Take one capsule by mouth daily before breakfast.      pantoprazole DR (PROTONIX) 40 mg tablet Take one tablet by mouth daily.      tamsulosin (FLOMAX) 0.4 mg capsule Take one capsule by mouth daily. Do not crush, chew or open capsules. Take 30 minutes following the same meal each day.      zonisamide (ZONEGRAN) 100 mg capsule Take two capsules by mouth at bedtime daily. 180 capsule 3     No current facility-administered medications for this visit.       ALLERGIES     Allergies   Allergen Reactions    Gadolinium-Containing Contrast Media RASH and ITCHING    Shrimp EDEMA     Swelling of the tongue       PHYSICAL EXAMINATION         PHYSICAL EXAM:    General:  Devin Evans is a well-appearing individual in no acute distress.   Head:  Head normocephalic, atraumatic.     Neuro:   Mental status: Awake, attentive, and alert.   Patient oriented   General fund of knowledge and recent and remote memory appear intact based on subjective data/history provided.  Speech is fluent with normal articulation.   Language has intact comprehension; language content appears appropriate based on our conversation.   Pupils appear round and equal  Extraocular eye movements are full without nystagmus.   Face appears symmetric at rest   Hearing grossly intact.     Muscle bulk and tone appear normal   No adventitious movements.        STUDIES REVIEWED     No head imaging reviewed.     IMPRESSION/PLAN   Summary/Impression:  Devin Evans is a 62 y.o. male with left FLE related to a cavernoma.  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  Therefore, we won't make changes today.          Plan:      Continue ASM regimen as follows:  Zonisamide 200 mg nightly   Lamotrigine 200 mg BID   No changes made  Continue seizure safety precautions if applicable   Follow up with Dr. Ardelle Lesches in 6-8 months or sooner if needed.         Approximately 35 minutes (total time) were spent with the patient/family member/carof which more than 50% were spent in preparing to see the patient, obtaining and reviewing separately obtained history, ordering medications, tests, or procedures, documenting clinical information, independently interpreting results counseling and educating the patient/family member, and/or coordination of care on 11/05/22. The patient understands and agrees with the plan of care.        Electronically signed by  Dwana Melena, APRN-NP   Epilepsy APP  [facility]  [facility]  on  11/05/2022 at 1:33 PM.

## 2022-11-06 DIAGNOSIS — G40109 Localization-related (focal) (partial) symptomatic epilepsy and epileptic syndromes with simple partial seizures, not intractable, without status epilepticus: Secondary | ICD-10-CM

## 2023-05-30 ENCOUNTER — Encounter: Admit: 2023-05-30 | Discharge: 2023-05-30 | Payer: MEDICARE

## 2023-07-11 ENCOUNTER — Encounter: Admit: 2023-07-11 | Discharge: 2023-07-11 | Payer: MEDICARE

## 2023-07-12 ENCOUNTER — Encounter: Admit: 2023-07-12 | Discharge: 2023-07-12 | Payer: MEDICARE

## 2023-08-28 ENCOUNTER — Encounter: Admit: 2023-08-28 | Discharge: 2023-08-28 | Payer: MEDICARE

## 2023-08-28 DIAGNOSIS — G40109 Localization-related (focal) (partial) symptomatic epilepsy and epileptic syndromes with simple partial seizures, not intractable, without status epilepticus: Secondary | ICD-10-CM

## 2023-08-28 MED ORDER — ZONISAMIDE 100 MG PO CAP
ORAL_CAPSULE | ORAL | 3 refills | 90.00000 days | Status: AC
Start: 2023-08-28 — End: ?

## 2023-11-22 ENCOUNTER — Encounter: Admit: 2023-11-22 | Discharge: 2023-11-22 | Payer: MEDICARE

## 2024-04-27 ENCOUNTER — Encounter: Admit: 2024-04-27 | Discharge: 2024-04-27 | Payer: MEDICARE

## 2024-10-23 ENCOUNTER — Encounter: Admit: 2024-10-23 | Discharge: 2024-10-23 | Payer: MEDICARE
